# Patient Record
Sex: Male | Born: 1960 | Race: White | Hispanic: No | Marital: Married | Smoking: Former smoker
Health system: Southern US, Community
[De-identification: ages and names within clinical notes are randomized; demographics above are authoritative.]

## PROBLEM LIST (undated history)

## (undated) DIAGNOSIS — E7800 Pure hypercholesterolemia, unspecified: Secondary | ICD-10-CM

## (undated) DIAGNOSIS — I4891 Unspecified atrial fibrillation: Secondary | ICD-10-CM

## (undated) HISTORY — PX: TONSILLECTOMY: SUR1361

## (undated) HISTORY — PX: HERNIA REPAIR: SHX51

---

## 2001-03-18 ENCOUNTER — Encounter: Admission: RE | Admit: 2001-03-18 | Discharge: 2001-03-18 | Payer: Self-pay | Admitting: Family Medicine

## 2001-03-18 ENCOUNTER — Encounter: Payer: Self-pay | Admitting: Family Medicine

## 2014-02-08 ENCOUNTER — Emergency Department (HOSPITAL_COMMUNITY)
Admission: EM | Admit: 2014-02-08 | Discharge: 2014-02-08 | Disposition: A | Discharge status: Home / Self Care | Payer: No Typology Code available for payment source | Attending: Emergency Medicine | Admitting: Emergency Medicine

## 2014-02-08 ENCOUNTER — Emergency Department (HOSPITAL_COMMUNITY): Payer: No Typology Code available for payment source

## 2014-02-08 ENCOUNTER — Encounter (HOSPITAL_COMMUNITY): Payer: Self-pay

## 2014-02-08 DIAGNOSIS — S79912A Unspecified injury of left hip, initial encounter: Secondary | ICD-10-CM | POA: Insufficient documentation

## 2014-02-08 DIAGNOSIS — S3993XA Unspecified injury of pelvis, initial encounter: Secondary | ICD-10-CM | POA: Insufficient documentation

## 2014-02-08 DIAGNOSIS — Y92410 Unspecified street and highway as the place of occurrence of the external cause: Secondary | ICD-10-CM | POA: Insufficient documentation

## 2014-02-08 DIAGNOSIS — Z87891 Personal history of nicotine dependence: Secondary | ICD-10-CM | POA: Insufficient documentation

## 2014-02-08 DIAGNOSIS — S50312A Abrasion of left elbow, initial encounter: Secondary | ICD-10-CM | POA: Insufficient documentation

## 2014-02-08 DIAGNOSIS — S0081XA Abrasion of other part of head, initial encounter: Secondary | ICD-10-CM | POA: Insufficient documentation

## 2014-02-08 DIAGNOSIS — S79911A Unspecified injury of right hip, initial encounter: Secondary | ICD-10-CM | POA: Diagnosis not present

## 2014-02-08 DIAGNOSIS — Z8639 Personal history of other endocrine, nutritional and metabolic disease: Secondary | ICD-10-CM | POA: Insufficient documentation

## 2014-02-08 DIAGNOSIS — Y998 Other external cause status: Secondary | ICD-10-CM | POA: Insufficient documentation

## 2014-02-08 DIAGNOSIS — Y9389 Activity, other specified: Secondary | ICD-10-CM | POA: Insufficient documentation

## 2014-02-08 DIAGNOSIS — S139XXA Sprain of joints and ligaments of unspecified parts of neck, initial encounter: Secondary | ICD-10-CM

## 2014-02-08 DIAGNOSIS — S134XXA Sprain of ligaments of cervical spine, initial encounter: Secondary | ICD-10-CM | POA: Diagnosis not present

## 2014-02-08 DIAGNOSIS — Z23 Encounter for immunization: Secondary | ICD-10-CM | POA: Insufficient documentation

## 2014-02-08 DIAGNOSIS — S199XXA Unspecified injury of neck, initial encounter: Secondary | ICD-10-CM | POA: Diagnosis present

## 2014-02-08 HISTORY — DX: Unspecified atrial fibrillation: I48.91

## 2014-02-08 HISTORY — DX: Pure hypercholesterolemia, unspecified: E78.00

## 2014-02-08 MED ORDER — TETANUS-DIPHTH-ACELL PERTUSSIS 5-2.5-18.5 LF-MCG/0.5 IM SUSP
0.5000 mL | Freq: Once | INTRAMUSCULAR | Status: AC
  Administered 2014-02-08: 0.5 mL via INTRAMUSCULAR
  Filled 2014-02-08: qty 0.5

## 2014-02-08 MED ORDER — HYDROCODONE-ACETAMINOPHEN 5-325 MG PO TABS: 1.0000 | ORAL_TABLET | Freq: Four times a day (QID) | ORAL | Status: AC | PRN

## 2014-02-08 MED ORDER — NAPROXEN 500 MG PO TABS: 500.0000 mg | ORAL_TABLET | Freq: Two times a day (BID) | ORAL | Status: AC

## 2014-02-08 MED ORDER — CYCLOBENZAPRINE HCL 10 MG PO TABS: 10.0000 mg | ORAL_TABLET | Freq: Two times a day (BID) | ORAL | Status: AC | PRN
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
## 2014-02-08 NOTE — Discharge Instructions (Signed)
Your CT scans looked normal. Take naproxen for pain, Norco for severe pain. Flexeril for muscle spasms. Follow-up with your doctor as needed. Return if worsening symptoms.    Cervical Sprain A cervical sprain is an injury in the neck in which the strong, fibrous tissues (ligaments) that connect your neck bones stretch or tear. Cervical sprains can range from mild to severe. Severe cervical sprains can cause the neck vertebrae to be unstable. This can lead to damage of the spinal cord and can result in serious nervous system problems. The amount of time it takes for a cervical sprain to get better depends on the cause and extent of the injury. Most cervical sprains heal in 1 to 3 weeks. CAUSES  Severe cervical sprains may be caused by:   Contact sport injuries (such as from football, rugby, wrestling, hockey, auto racing, gymnastics, diving, martial arts, or boxing).   Motor vehicle collisions.   Whiplash injuries. This is an injury from a sudden forward and backward whipping movement of the head and neck.  Falls.  Mild cervical sprains may be caused by:   Being in an awkward position, such as while cradling a telephone between your ear and shoulder.   Sitting in a chair that does not offer proper support.   Working at a poorly Marketing executive station.   Looking up or down for long periods of time.  SYMPTOMS   Pain, soreness, stiffness, or a burning sensation in the front, back, or sides of the neck. This discomfort may develop immediately after the injury or slowly, 24 hours or more after the injury.   Pain or tenderness directly in the middle of the back of the neck.   Shoulder or upper back pain.   Limited ability to move the neck.   Headache.   Dizziness.   Weakness, numbness, or tingling in the hands or arms.   Muscle spasms.   Difficulty swallowing or chewing.   Tenderness and swelling of the neck.  DIAGNOSIS  Most of the time your health care  provider can diagnose a cervical sprain by taking your history and doing a physical exam. Your health care provider will ask about previous neck injuries and any known neck problems, such as arthritis in the neck. X-rays may be taken to find out if there are any other problems, such as with the bones of the neck. Other tests, such as a CT scan or MRI, may also be needed.  TREATMENT  Treatment depends on the severity of the cervical sprain. Mild sprains can be treated with rest, keeping the neck in place (immobilization), and pain medicines. Severe cervical sprains are immediately immobilized. Further treatment is done to help with pain, muscle spasms, and other symptoms and may include:  Medicines, such as pain relievers, numbing medicines, or muscle relaxants.   Physical therapy. This may involve stretching exercises, strengthening exercises, and posture training. Exercises and improved posture can help stabilize the neck, strengthen muscles, and help stop symptoms from returning.  HOME CARE INSTRUCTIONS   Put ice on the injured area.   Put ice in a plastic bag.   Place a towel between your skin and the bag.   Leave the ice on for 15-20 minutes, 3-4 times a day.   If your injury was severe, you may have been given a cervical collar to wear. A cervical collar is a two-piece collar designed to keep your neck from moving while it heals.  Do not remove the collar unless instructed by  your health care provider.  If you have long hair, keep it outside of the collar.  Ask your health care provider before making any adjustments to your collar. Minor adjustments may be required over time to improve comfort and reduce pressure on your chin or on the back of your head.  Ifyou are allowed to remove the collar for cleaning or bathing, follow your health care provider's instructions on how to do so safely.  Keep your collar clean by wiping it with mild soap and water and drying it completely. If  the collar you have been given includes removable pads, remove them every 1-2 days and hand wash them with soap and water. Allow them to air dry. They should be completely dry before you wear them in the collar.  If you are allowed to remove the collar for cleaning and bathing, wash and dry the skin of your neck. Check your skin for irritation or sores. If you see any, tell your health care provider.  Do not drive while wearing the collar.   Only take over-the-counter or prescription medicines for pain, discomfort, or fever as directed by your health care provider.   Keep all follow-up appointments as directed by your health care provider.   Keep all physical therapy appointments as directed by your health care provider.   Make any needed adjustments to your workstation to promote good posture.   Avoid positions and activities that make your symptoms worse.   Warm up and stretch before being active to help prevent problems.  SEEK MEDICAL CARE IF:   Your pain is not controlled with medicine.   You are unable to decrease your pain medicine over time as planned.   Your activity level is not improving as expected.  SEEK IMMEDIATE MEDICAL CARE IF:   You develop any bleeding.  You develop stomach upset.  You have signs of an allergic reaction to your medicine.   Your symptoms get worse.   You develop new, unexplained symptoms.   You have numbness, tingling, weakness, or paralysis in any part of your body.  MAKE SURE YOU:   Understand these instructions.  Will watch your condition.  Will get help right away if you are not doing well or get worse. Document Released: 11/04/2006 Document Revised: 01/12/2013 Document Reviewed: 07/15/2012 [facility] Patient Information 2015 [place]. This information is not intended to replace advice given to you by your health care provider. Make sure you discuss any questions you have with your health care provider.   Motor  Vehicle Collision It is common to have multiple bruises and sore muscles after a motor vehicle collision (MVC). These tend to feel worse for the first 24 hours. You may have the most stiffness and soreness over the first several hours. You may also feel worse when you wake up the first morning after your collision. After this point, you will usually begin to improve with each day. The speed of improvement often depends on the severity of the collision, the number of injuries, and the location and nature of these injuries. HOME CARE INSTRUCTIONS  Put ice on the injured area.  Put ice in a plastic bag.  Place a towel between your skin and the bag.  Leave the ice on for 15-20 minutes, 3-4 times a day, or as directed by your health care provider.  Drink enough fluids to keep your urine clear or pale yellow. Do not drink alcohol.  Take a warm shower or bath once or twice a  day. This will increase blood flow to sore muscles.  You may return to activities as directed by your caregiver. Be careful when lifting, as this may aggravate neck or back pain.  Only take over-the-counter or prescription medicines for pain, discomfort, or fever as directed by your caregiver. Do not use aspirin. This may increase bruising and bleeding. SEEK IMMEDIATE MEDICAL CARE IF:  You have numbness, tingling, or weakness in the arms or legs.  You develop severe headaches not relieved with medicine.  You have severe neck pain, especially tenderness in the middle of the back of your neck.  You have changes in bowel or bladder control.  There is increasing pain in any area of the body.  You have shortness of breath, light-headedness, dizziness, or fainting.  You have chest pain.  You feel sick to your stomach (nauseous), throw up (vomit), or sweat.  You have increasing abdominal discomfort.  There is blood in your urine, stool, or vomit.  You have pain in your shoulder (shoulder strap areas).  You feel your  symptoms are getting worse. MAKE SURE YOU:  Understand these instructions.  Will watch your condition.  Will get help right away if you are not doing well or get worse. Document Released: 01/07/2005 Document Revised: 05/24/2013 Document Reviewed: 06/06/2010 [facility] Patient Information 2015 [place], [place]. This information is not intended to replace advice given to you by your health care provider. Make sure you discuss any questions you have with your health care provider.

## 2014-02-08 NOTE — ED Notes (Signed)
Patient transported to CT 
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
